# Patient Record
Sex: Female | Born: 1970
Health system: Southern US, Community
[De-identification: ages and names within clinical notes are randomized; demographics above are authoritative.]

---

## 2007-01-10 ENCOUNTER — Other Ambulatory Visit: Admission: RE | Admit: 2007-01-10 | Discharge: 2007-01-10 | Payer: Self-pay | Admitting: Family Medicine

## 2007-04-23 ENCOUNTER — Encounter: Admission: RE | Admit: 2007-04-23 | Discharge: 2007-04-23 | Payer: Self-pay | Admitting: Family Medicine

## 2008-01-15 ENCOUNTER — Other Ambulatory Visit: Admission: RE | Admit: 2008-01-15 | Discharge: 2008-01-15 | Payer: Self-pay | Admitting: Family Medicine

## 2008-03-22 ENCOUNTER — Emergency Department (HOSPITAL_COMMUNITY): Admission: EM | Admit: 2008-03-22 | Discharge: 2008-03-23 | Payer: Self-pay | Admitting: Emergency Medicine

## 2008-06-19 ENCOUNTER — Encounter: Admission: RE | Admit: 2008-06-19 | Discharge: 2008-06-19 | Payer: Self-pay | Admitting: Family Medicine

## 2010-07-05 ENCOUNTER — Other Ambulatory Visit: Admission: RE | Admit: 2010-07-05 | Discharge: 2010-07-05 | Payer: Self-pay | Admitting: Family Medicine

## 2010-09-15 ENCOUNTER — Other Ambulatory Visit: Payer: Self-pay | Admitting: Family Medicine

## 2010-09-15 DIAGNOSIS — Z1231 Encounter for screening mammogram for malignant neoplasm of breast: Secondary | ICD-10-CM

## 2010-09-20 ENCOUNTER — Ambulatory Visit
Admission: RE | Admit: 2010-09-20 | Discharge: 2010-09-20 | Disposition: A | Discharge status: Home / Self Care | Payer: Self-pay | Source: Ambulatory Visit | Attending: Family Medicine | Admitting: Family Medicine

## 2010-09-20 DIAGNOSIS — Z1231 Encounter for screening mammogram for malignant neoplasm of breast: Secondary | ICD-10-CM

## 2011-03-04 ENCOUNTER — Ambulatory Visit (HOSPITAL_COMMUNITY)
Admission: AD | Admit: 2011-03-04 | Discharge: 2011-03-04 | Disposition: A | Discharge status: Home / Self Care | Payer: BC Managed Care – PPO | Source: Ambulatory Visit | Attending: Orthopedic Surgery | Admitting: Orthopedic Surgery

## 2011-03-04 DIAGNOSIS — M79609 Pain in unspecified limb: Secondary | ICD-10-CM | POA: Insufficient documentation

## 2011-03-04 DIAGNOSIS — M7989 Other specified soft tissue disorders: Secondary | ICD-10-CM | POA: Insufficient documentation

## 2011-03-14 ENCOUNTER — Ambulatory Visit
Admission: RE | Admit: 2011-03-14 | Discharge: 2011-03-14 | Disposition: A | Discharge status: Home / Self Care | Payer: BC Managed Care – PPO | Source: Ambulatory Visit | Attending: Family Medicine | Admitting: Family Medicine

## 2011-03-14 ENCOUNTER — Other Ambulatory Visit: Payer: Self-pay | Admitting: Family Medicine

## 2011-03-14 DIAGNOSIS — R0602 Shortness of breath: Secondary | ICD-10-CM

## 2011-03-14 MED ORDER — IOHEXOL 300 MG/ML  SOLN
125.0000 mL | Freq: Once | INTRAMUSCULAR | Status: AC | PRN
  Administered 2011-03-14: 125 mL via INTRAVENOUS

## 2011-03-15 ENCOUNTER — Other Ambulatory Visit: Payer: Self-pay | Admitting: Family Medicine

## 2012-06-03 ENCOUNTER — Other Ambulatory Visit (HOSPITAL_COMMUNITY)
Admission: RE | Admit: 2012-06-03 | Discharge: 2012-06-03 | Disposition: A | Discharge status: Home / Self Care | Payer: BC Managed Care – PPO | Source: Ambulatory Visit | Attending: Family Medicine | Admitting: Family Medicine

## 2012-06-03 DIAGNOSIS — Z1151 Encounter for screening for human papillomavirus (HPV): Secondary | ICD-10-CM | POA: Insufficient documentation

## 2012-06-03 DIAGNOSIS — Z01419 Encounter for gynecological examination (general) (routine) without abnormal findings: Secondary | ICD-10-CM | POA: Insufficient documentation

## 2012-07-16 ENCOUNTER — Other Ambulatory Visit: Payer: Self-pay | Admitting: Otolaryngology

## 2012-07-16 DIAGNOSIS — H9319 Tinnitus, unspecified ear: Secondary | ICD-10-CM

## 2012-07-16 DIAGNOSIS — H905 Unspecified sensorineural hearing loss: Secondary | ICD-10-CM

## 2012-07-18 ENCOUNTER — Other Ambulatory Visit: Payer: BC Managed Care – PPO

## 2012-08-09 ENCOUNTER — Ambulatory Visit
Admission: RE | Admit: 2012-08-09 | Discharge: 2012-08-09 | Disposition: A | Discharge status: Home / Self Care | Payer: BC Managed Care – PPO | Source: Ambulatory Visit | Attending: Otolaryngology | Admitting: Otolaryngology

## 2012-08-09 DIAGNOSIS — H9319 Tinnitus, unspecified ear: Secondary | ICD-10-CM

## 2012-08-09 DIAGNOSIS — H905 Unspecified sensorineural hearing loss: Secondary | ICD-10-CM

## 2012-08-09 MED ORDER — GADOBENATE DIMEGLUMINE 529 MG/ML IV SOLN
15.0000 mL | Freq: Once | INTRAVENOUS | Status: AC | PRN
  Administered 2012-08-09: 15 mL via INTRAVENOUS

## 2014-04-30 ENCOUNTER — Encounter: Payer: Self-pay | Admitting: Cardiology

## 2014-04-30 ENCOUNTER — Ambulatory Visit (INDEPENDENT_AMBULATORY_CARE_PROVIDER_SITE_OTHER): Payer: BC Managed Care – PPO | Admitting: Cardiology

## 2014-04-30 VITALS — BP 118/78 | HR 64 | Ht 65.5 in | Wt 176.0 lb

## 2014-04-30 DIAGNOSIS — R42 Dizziness and giddiness: Secondary | ICD-10-CM

## 2014-04-30 DIAGNOSIS — R002 Palpitations: Secondary | ICD-10-CM

## 2014-04-30 DIAGNOSIS — R011 Cardiac murmur, unspecified: Secondary | ICD-10-CM

## 2014-04-30 NOTE — Patient Instructions (Addendum)
Your physician has requested that you have an echocardiogram. Echocardiography is a painless test that uses sound waves to create images of your heart. It provides your doctor with information about the size and shape of your heart and how well your heart's chambers and valves are working. This procedure takes approximately one hour. There are no restrictions for this procedure.  Your physician has recommended that you wear a holter monitor. Holter monitors are medical devices that record the heart's electrical activity. Doctors most often use these monitors to diagnose arrhythmias. Arrhythmias are problems with the speed or rhythm of the heartbeat. The monitor is a small, portable device. You can wear one while you do your normal daily activities. This is usually used to diagnose what is causing palpitations/syncope (passing out). 48 hour   Follow up as needed

## 2014-04-30 NOTE — Progress Notes (Signed)
Tiffany Vasquez Date of Birth:  06/05/1971 [facility] [address] [place], [place]  [address] [phone]        Fax   [phone]   History of Present Illness: This pleasant 43 year old woman is seen by me for the first time today.  She is being seen at the request of Dr. Sigmund Hazel for evaluation of palpitations.  She does not have any history of known heart problems.  She does not have any history of exertional chest pain or dyspnea.  She walks 2-3 miles a day.  In August she and her husband did a lot of hiking in [place] and she had no problems.  2 weeks ago she began having palpitations occurring at rest.  She saw her PCP and received EKG and labs and everything checked out okay.  Her symptoms do not occur with exertion.  They occur at rest.  At times her heart feels rapid and at other times it feels normal speed but irregular.  At times it makes her slightly lightheaded and slightly nauseated.  It makes her feel very apprehensive and tense. The patient feels that she may be menopausal.  She had an endometrial ablation about 10 years ago.  She has begun to have hot flashes. The patient has been basically healthy.  She does not have any history of high blood pressure diabetes or high cholesterol. Her family history reveals that her mother has a history of a heart murmur and high blood pressure.  Her father did not have heart problems.  He died of lung cancer.   Current Outpatient Prescriptions  Medication Sig Dispense Refill  . rizatriptan (MAXALT) 10 MG tablet Take 10 mg by mouth as needed for migraine.        No current facility-administered medications for this visit.    Not on File  Patient Active Problem List   Diagnosis Date Noted  . Intermittent palpitations 04/30/2014  . Episodic lightheadedness 04/30/2014  . Heart murmur, systolic 04/30/2014    History  Smoking status  . Never Smoker   Smokeless tobacco  . Not on file    History    Alcohol Use No    No family history on file.  The family history is negative for premature heart problems  Review of Systems: Constitutional: no fever chills diaphoresis or fatigue or change in weight.  Head and neck: no hearing loss, no epistaxis, no photophobia or visual disturbance. Respiratory: No cough, shortness of breath or wheezing. Cardiovascular: No chest pain peripheral edema, positive for palpitations at rest Gastrointestinal: No abdominal distention, no abdominal pain, no change in bowel habits hematochezia or melena. Genitourinary: No dysuria, no frequency, no urgency, no nocturia. Musculoskeletal:No arthralgias, no back pain, no gait disturbance or myalgias. Neurological: No dizziness, no headaches, no numbness, no seizures, no syncope, no weakness, no tremors. Hematologic: No lymphadenopathy, no easy bruising. Psychiatric: No confusion, no hallucinations, no sleep disturbance.    Physical Exam: Filed Vitals:   04/30/14 1030  BP: 118/78  Pulse: 64  The patient appears to be in no distress.  Head and neck exam reveals that the pupils are equal and reactive.  The extraocular movements are full.  There is no scleral icterus.  Mouth and pharynx are benign.  No lymphadenopathy.  No carotid bruits.  The jugular venous pressure is normal.  Thyroid is not enlarged or tender.  Chest is clear to percussion and auscultation.  No rales or rhonchi.  Expansion of the  chest is symmetrical.  Heart reveals no abnormal lift or heave.  First and second heart sounds are normal.  The pulse is regular.  No premature beats heard. There is a grade 1/6 systolic ejection murmur at the base.  The abdomen is soft and nontender.  Bowel sounds are normoactive.  There is no hepatosplenomegaly or mass.  There are no abdominal bruits.  Extremities reveal no phlebitis or edema.  Pedal pulses are good.  There is no cyanosis or clubbing.  Neurologic exam is normal strength and no lateralizing  weakness.  No sensory deficits.  Integument reveals no rash  EKG shows normal sinus rhythm and is within normal limits.  The QTc interval is normal.  Assessment / Plan: 1. palpitations at rest, recent onset 2. systolic heart murmur 3. possible perimenopausal symptoms with hot flashes  Disposition: The etiology of her palpitations is not clear.  We want to confirm the presence of her arrhythmia.  She states that she is having these symptoms on a daily basis.  We will have her wear a 48 hour Holter monitor.  We will also have her return for a two-dimensional echo.  Anything she the opportunity to see this pleasant woman with you.  I will be in touch with you regarding the results of her studies.

## 2014-05-06 ENCOUNTER — Encounter: Payer: Self-pay | Admitting: *Deleted

## 2014-05-06 ENCOUNTER — Encounter (INDEPENDENT_AMBULATORY_CARE_PROVIDER_SITE_OTHER): Payer: BC Managed Care – PPO

## 2014-05-06 ENCOUNTER — Telehealth: Payer: Self-pay | Admitting: *Deleted

## 2014-05-06 ENCOUNTER — Ambulatory Visit (HOSPITAL_COMMUNITY): Payer: BC Managed Care – PPO | Attending: Cardiovascular Disease | Admitting: Cardiology

## 2014-05-06 DIAGNOSIS — R011 Cardiac murmur, unspecified: Secondary | ICD-10-CM | POA: Diagnosis not present

## 2014-05-06 DIAGNOSIS — R002 Palpitations: Secondary | ICD-10-CM

## 2014-05-06 DIAGNOSIS — R42 Dizziness and giddiness: Secondary | ICD-10-CM

## 2014-05-06 NOTE — Telephone Encounter (Signed)
Message copied by Burnell BlanksPRATT, Darica Goren B on Wed May 06, 2014 11:21 AM ------      Message from: Cassell ClementBRACKBILL, THOMAS      Created: Wed May 06, 2014 10:38 AM       Please report.  The echocardiogram was normal.  There was good left ventricular systolic function .  Her valve function was normal.      Awaiting results of her event monitor.  Send copy of her echo to Dr. Sigmund HazelLisa Miller ------

## 2014-05-06 NOTE — Progress Notes (Signed)
Echo performed. 

## 2014-05-06 NOTE — Progress Notes (Signed)
Patient ID: Tiffany Vasquez, female   DOB: 08/17/1970, 43 y.o.   MRN: [id number] [employer] 48 hour holter monitor applied to patient.

## 2014-05-06 NOTE — Telephone Encounter (Signed)
Advised patient and will forward to Dr Stevie KernL Miller

## 2014-05-19 ENCOUNTER — Telehealth: Payer: Self-pay | Admitting: *Deleted

## 2014-05-19 NOTE — Telephone Encounter (Signed)
Left message to call back   30 day event monitor reviewed by  Dr. Patty SermonsBrackbill  Interpretation: No significant rhythm disturbance

## 2014-05-19 NOTE — Telephone Encounter (Signed)
Advised patient

## 2014-05-19 NOTE — Telephone Encounter (Signed)
Follow up ° ° ° ° °Returning Tiffany Vasquez's call °

## 2014-05-19 NOTE — Telephone Encounter (Signed)
Left message to call back   Was a 48 hour monitor, not 30 day event monitor.  Interpretation day 1: No significant rhythm disturbance   Interpretation day 2: No significant arrhythmia, rare PAC

## 2015-11-08 DIAGNOSIS — H9312 Tinnitus, left ear: Secondary | ICD-10-CM | POA: Diagnosis not present

## 2015-11-24 DIAGNOSIS — E663 Overweight: Secondary | ICD-10-CM | POA: Diagnosis not present

## 2015-11-24 DIAGNOSIS — N951 Menopausal and female climacteric states: Secondary | ICD-10-CM | POA: Diagnosis not present

## 2015-11-24 DIAGNOSIS — Z0000 Encounter for general adult medical examination without abnormal findings: Secondary | ICD-10-CM | POA: Diagnosis not present

## 2015-11-24 DIAGNOSIS — G43009 Migraine without aura, not intractable, without status migrainosus: Secondary | ICD-10-CM | POA: Diagnosis not present

## 2015-11-24 DIAGNOSIS — E559 Vitamin D deficiency, unspecified: Secondary | ICD-10-CM | POA: Diagnosis not present

## 2015-11-30 DIAGNOSIS — E559 Vitamin D deficiency, unspecified: Secondary | ICD-10-CM | POA: Diagnosis not present

## 2015-11-30 DIAGNOSIS — Z0000 Encounter for general adult medical examination without abnormal findings: Secondary | ICD-10-CM | POA: Diagnosis not present

## 2015-11-30 DIAGNOSIS — E663 Overweight: Secondary | ICD-10-CM | POA: Diagnosis not present

## 2016-01-18 DIAGNOSIS — J029 Acute pharyngitis, unspecified: Secondary | ICD-10-CM | POA: Diagnosis not present

## 2016-01-18 DIAGNOSIS — J020 Streptococcal pharyngitis: Secondary | ICD-10-CM | POA: Diagnosis not present

## 2016-03-04 DIAGNOSIS — L259 Unspecified contact dermatitis, unspecified cause: Secondary | ICD-10-CM | POA: Diagnosis not present

## 2016-04-05 DIAGNOSIS — J069 Acute upper respiratory infection, unspecified: Secondary | ICD-10-CM | POA: Diagnosis not present

## 2016-04-05 DIAGNOSIS — M791 Myalgia: Secondary | ICD-10-CM | POA: Diagnosis not present

## 2016-04-28 ENCOUNTER — Other Ambulatory Visit: Payer: Self-pay | Admitting: Family Medicine

## 2016-04-28 DIAGNOSIS — Z1231 Encounter for screening mammogram for malignant neoplasm of breast: Secondary | ICD-10-CM

## 2016-05-26 DIAGNOSIS — Z23 Encounter for immunization: Secondary | ICD-10-CM | POA: Diagnosis not present

## 2016-05-31 ENCOUNTER — Ambulatory Visit
Admission: RE | Admit: 2016-05-31 | Discharge: 2016-05-31 | Disposition: A | Discharge status: Home / Self Care | Payer: BLUE CROSS/BLUE SHIELD | Source: Ambulatory Visit | Attending: Family Medicine | Admitting: Family Medicine

## 2016-05-31 DIAGNOSIS — Z1231 Encounter for screening mammogram for malignant neoplasm of breast: Secondary | ICD-10-CM

## 2016-06-19 DIAGNOSIS — S99922A Unspecified injury of left foot, initial encounter: Secondary | ICD-10-CM | POA: Diagnosis not present

## 2016-11-29 ENCOUNTER — Other Ambulatory Visit (HOSPITAL_COMMUNITY)
Admission: RE | Admit: 2016-11-29 | Discharge: 2016-11-29 | Disposition: A | Discharge status: Home / Self Care | Payer: BLUE CROSS/BLUE SHIELD | Source: Ambulatory Visit | Attending: Family Medicine | Admitting: Family Medicine

## 2016-11-29 ENCOUNTER — Other Ambulatory Visit: Payer: Self-pay | Admitting: Family Medicine

## 2016-11-29 DIAGNOSIS — Z124 Encounter for screening for malignant neoplasm of cervix: Secondary | ICD-10-CM | POA: Insufficient documentation

## 2016-11-29 DIAGNOSIS — Z0000 Encounter for general adult medical examination without abnormal findings: Secondary | ICD-10-CM | POA: Diagnosis not present

## 2016-12-01 LAB — CYTOLOGY - PAP
DIAGNOSIS: NEGATIVE
HPV (WINDOPATH): NOT DETECTED

## 2016-12-12 DIAGNOSIS — E559 Vitamin D deficiency, unspecified: Secondary | ICD-10-CM | POA: Diagnosis not present

## 2016-12-12 DIAGNOSIS — Z0000 Encounter for general adult medical examination without abnormal findings: Secondary | ICD-10-CM | POA: Diagnosis not present

## 2016-12-12 DIAGNOSIS — Z13220 Encounter for screening for lipoid disorders: Secondary | ICD-10-CM | POA: Diagnosis not present

## 2016-12-12 DIAGNOSIS — Z131 Encounter for screening for diabetes mellitus: Secondary | ICD-10-CM | POA: Diagnosis not present

## 2017-03-26 DIAGNOSIS — Z23 Encounter for immunization: Secondary | ICD-10-CM | POA: Diagnosis not present

## 2017-03-28 ENCOUNTER — Other Ambulatory Visit: Payer: Self-pay | Admitting: Family Medicine

## 2017-03-28 ENCOUNTER — Ambulatory Visit
Admission: RE | Admit: 2017-03-28 | Discharge: 2017-03-28 | Disposition: A | Discharge status: Home / Self Care | Payer: BLUE CROSS/BLUE SHIELD | Source: Ambulatory Visit | Attending: Family Medicine | Admitting: Family Medicine

## 2017-03-28 DIAGNOSIS — M545 Low back pain: Principal | ICD-10-CM

## 2017-03-28 DIAGNOSIS — G8929 Other chronic pain: Secondary | ICD-10-CM

## 2017-04-02 DIAGNOSIS — M545 Low back pain: Secondary | ICD-10-CM | POA: Diagnosis not present

## 2017-04-02 DIAGNOSIS — M25552 Pain in left hip: Secondary | ICD-10-CM | POA: Diagnosis not present

## 2017-04-12 DIAGNOSIS — M545 Low back pain: Secondary | ICD-10-CM | POA: Diagnosis not present

## 2017-04-12 DIAGNOSIS — M25552 Pain in left hip: Secondary | ICD-10-CM | POA: Diagnosis not present

## 2017-04-30 DIAGNOSIS — M545 Low back pain: Secondary | ICD-10-CM | POA: Diagnosis not present

## 2017-04-30 DIAGNOSIS — M25552 Pain in left hip: Secondary | ICD-10-CM | POA: Diagnosis not present

## 2017-05-09 DIAGNOSIS — M545 Low back pain: Secondary | ICD-10-CM | POA: Diagnosis not present

## 2017-05-21 DIAGNOSIS — M25552 Pain in left hip: Secondary | ICD-10-CM | POA: Diagnosis not present

## 2017-05-21 DIAGNOSIS — M545 Low back pain: Secondary | ICD-10-CM | POA: Diagnosis not present

## 2017-05-22 DIAGNOSIS — D224 Melanocytic nevi of scalp and neck: Secondary | ICD-10-CM | POA: Diagnosis not present

## 2017-05-22 DIAGNOSIS — D2271 Melanocytic nevi of right lower limb, including hip: Secondary | ICD-10-CM | POA: Diagnosis not present

## 2017-05-22 DIAGNOSIS — D2230 Melanocytic nevi of unspecified part of face: Secondary | ICD-10-CM | POA: Diagnosis not present

## 2017-05-22 DIAGNOSIS — D2221 Melanocytic nevi of right ear and external auricular canal: Secondary | ICD-10-CM | POA: Diagnosis not present

## 2017-05-23 ENCOUNTER — Other Ambulatory Visit: Payer: Self-pay | Admitting: Family Medicine

## 2017-05-23 DIAGNOSIS — Z1231 Encounter for screening mammogram for malignant neoplasm of breast: Secondary | ICD-10-CM

## 2017-05-29 DIAGNOSIS — N951 Menopausal and female climacteric states: Secondary | ICD-10-CM | POA: Diagnosis not present

## 2017-06-20 ENCOUNTER — Ambulatory Visit: Payer: BLUE CROSS/BLUE SHIELD

## 2017-07-19 ENCOUNTER — Ambulatory Visit: Payer: BLUE CROSS/BLUE SHIELD

## 2017-08-08 DIAGNOSIS — N951 Menopausal and female climacteric states: Secondary | ICD-10-CM | POA: Diagnosis not present

## 2017-08-17 ENCOUNTER — Ambulatory Visit
Admission: RE | Admit: 2017-08-17 | Discharge: 2017-08-17 | Disposition: A | Discharge status: Home / Self Care | Payer: BLUE CROSS/BLUE SHIELD | Source: Ambulatory Visit | Attending: Family Medicine | Admitting: Family Medicine

## 2017-08-17 DIAGNOSIS — Z1231 Encounter for screening mammogram for malignant neoplasm of breast: Secondary | ICD-10-CM | POA: Diagnosis not present

## 2018-04-10 DIAGNOSIS — Z23 Encounter for immunization: Secondary | ICD-10-CM | POA: Diagnosis not present

## 2018-04-17 DIAGNOSIS — R5383 Other fatigue: Secondary | ICD-10-CM | POA: Diagnosis not present

## 2018-04-17 DIAGNOSIS — E559 Vitamin D deficiency, unspecified: Secondary | ICD-10-CM | POA: Diagnosis not present

## 2018-04-17 DIAGNOSIS — Z0000 Encounter for general adult medical examination without abnormal findings: Secondary | ICD-10-CM | POA: Diagnosis not present

## 2018-05-29 DIAGNOSIS — D2221 Melanocytic nevi of right ear and external auricular canal: Secondary | ICD-10-CM | POA: Diagnosis not present

## 2018-05-29 DIAGNOSIS — Z808 Family history of malignant neoplasm of other organs or systems: Secondary | ICD-10-CM | POA: Diagnosis not present

## 2018-05-29 DIAGNOSIS — Z85828 Personal history of other malignant neoplasm of skin: Secondary | ICD-10-CM | POA: Diagnosis not present

## 2018-05-29 DIAGNOSIS — D224 Melanocytic nevi of scalp and neck: Secondary | ICD-10-CM | POA: Diagnosis not present

## 2018-05-29 DIAGNOSIS — Z23 Encounter for immunization: Secondary | ICD-10-CM | POA: Diagnosis not present

## 2018-05-29 DIAGNOSIS — L918 Other hypertrophic disorders of the skin: Secondary | ICD-10-CM | POA: Diagnosis not present

## 2018-06-25 DIAGNOSIS — L089 Local infection of the skin and subcutaneous tissue, unspecified: Secondary | ICD-10-CM | POA: Diagnosis not present

## 2018-06-25 DIAGNOSIS — W57XXXA Bitten or stung by nonvenomous insect and other nonvenomous arthropods, initial encounter: Secondary | ICD-10-CM | POA: Diagnosis not present

## 2018-08-15 ENCOUNTER — Other Ambulatory Visit: Payer: Self-pay | Admitting: Family Medicine

## 2018-08-15 DIAGNOSIS — Z1231 Encounter for screening mammogram for malignant neoplasm of breast: Secondary | ICD-10-CM

## 2018-08-21 DIAGNOSIS — R0683 Snoring: Secondary | ICD-10-CM | POA: Diagnosis not present

## 2018-08-21 DIAGNOSIS — G4719 Other hypersomnia: Secondary | ICD-10-CM | POA: Diagnosis not present

## 2018-08-21 DIAGNOSIS — G478 Other sleep disorders: Secondary | ICD-10-CM | POA: Diagnosis not present

## 2018-08-21 DIAGNOSIS — N951 Menopausal and female climacteric states: Secondary | ICD-10-CM | POA: Diagnosis not present

## 2018-08-22 DIAGNOSIS — D485 Neoplasm of uncertain behavior of skin: Secondary | ICD-10-CM | POA: Diagnosis not present

## 2018-08-22 DIAGNOSIS — L570 Actinic keratosis: Secondary | ICD-10-CM | POA: Diagnosis not present

## 2018-09-11 ENCOUNTER — Ambulatory Visit
Admission: RE | Admit: 2018-09-11 | Discharge: 2018-09-11 | Disposition: A | Discharge status: Home / Self Care | Payer: BLUE CROSS/BLUE SHIELD | Source: Ambulatory Visit | Attending: Family Medicine | Admitting: Family Medicine

## 2018-09-11 DIAGNOSIS — Z1231 Encounter for screening mammogram for malignant neoplasm of breast: Secondary | ICD-10-CM | POA: Diagnosis not present

## 2018-09-18 DIAGNOSIS — G4710 Hypersomnia, unspecified: Secondary | ICD-10-CM | POA: Diagnosis not present

## 2018-09-18 DIAGNOSIS — G4719 Other hypersomnia: Secondary | ICD-10-CM | POA: Diagnosis not present

## 2019-02-11 ENCOUNTER — Other Ambulatory Visit: Payer: Self-pay | Admitting: *Deleted

## 2019-02-11 DIAGNOSIS — R6889 Other general symptoms and signs: Secondary | ICD-10-CM | POA: Diagnosis not present

## 2019-02-11 DIAGNOSIS — Z20822 Contact with and (suspected) exposure to covid-19: Secondary | ICD-10-CM

## 2019-02-14 LAB — NOVEL CORONAVIRUS, NAA: SARS-CoV-2, NAA: NOT DETECTED

## 2019-04-15 DIAGNOSIS — M62830 Muscle spasm of back: Secondary | ICD-10-CM | POA: Diagnosis not present

## 2019-05-14 DIAGNOSIS — N951 Menopausal and female climacteric states: Secondary | ICD-10-CM | POA: Diagnosis not present

## 2019-05-14 DIAGNOSIS — Z0000 Encounter for general adult medical examination without abnormal findings: Secondary | ICD-10-CM | POA: Diagnosis not present

## 2019-05-14 DIAGNOSIS — Z13220 Encounter for screening for lipoid disorders: Secondary | ICD-10-CM | POA: Diagnosis not present

## 2019-05-14 DIAGNOSIS — E559 Vitamin D deficiency, unspecified: Secondary | ICD-10-CM | POA: Diagnosis not present

## 2019-05-14 DIAGNOSIS — E669 Obesity, unspecified: Secondary | ICD-10-CM | POA: Diagnosis not present

## 2019-05-14 DIAGNOSIS — Z131 Encounter for screening for diabetes mellitus: Secondary | ICD-10-CM | POA: Diagnosis not present

## 2019-07-17 DIAGNOSIS — D485 Neoplasm of uncertain behavior of skin: Secondary | ICD-10-CM | POA: Diagnosis not present

## 2019-07-17 DIAGNOSIS — L570 Actinic keratosis: Secondary | ICD-10-CM | POA: Diagnosis not present

## 2019-07-17 DIAGNOSIS — L810 Postinflammatory hyperpigmentation: Secondary | ICD-10-CM | POA: Diagnosis not present

## 2019-08-04 ENCOUNTER — Other Ambulatory Visit (HOSPITAL_BASED_OUTPATIENT_CLINIC_OR_DEPARTMENT_OTHER): Payer: Self-pay

## 2019-08-04 DIAGNOSIS — G4710 Hypersomnia, unspecified: Secondary | ICD-10-CM

## 2019-08-23 ENCOUNTER — Other Ambulatory Visit (HOSPITAL_COMMUNITY)
Admission: RE | Admit: 2019-08-23 | Discharge: 2019-08-23 | Disposition: A | Discharge status: Home / Self Care | Payer: BC Managed Care – PPO | Source: Ambulatory Visit | Attending: Internal Medicine | Admitting: Internal Medicine

## 2019-08-23 DIAGNOSIS — Z20822 Contact with and (suspected) exposure to covid-19: Secondary | ICD-10-CM | POA: Diagnosis not present

## 2019-08-23 DIAGNOSIS — Z01812 Encounter for preprocedural laboratory examination: Secondary | ICD-10-CM | POA: Diagnosis not present

## 2019-08-23 LAB — SARS CORONAVIRUS 2 (TAT 6-24 HRS): SARS Coronavirus 2: NEGATIVE

## 2019-08-23 NOTE — Progress Notes (Signed)
LVM for pt to come of rcovid swab, as her sleep study is scheduled for [facility] 08/25/19.

## 2019-08-25 ENCOUNTER — Ambulatory Visit (HOSPITAL_BASED_OUTPATIENT_CLINIC_OR_DEPARTMENT_OTHER): Payer: BC Managed Care – PPO | Attending: Internal Medicine | Admitting: Internal Medicine

## 2019-08-25 ENCOUNTER — Other Ambulatory Visit: Payer: Self-pay

## 2019-08-25 DIAGNOSIS — G4710 Hypersomnia, unspecified: Secondary | ICD-10-CM | POA: Insufficient documentation

## 2019-08-26 ENCOUNTER — Ambulatory Visit (HOSPITAL_BASED_OUTPATIENT_CLINIC_OR_DEPARTMENT_OTHER): Payer: BLUE CROSS/BLUE SHIELD | Attending: Internal Medicine | Admitting: Internal Medicine

## 2019-08-26 DIAGNOSIS — G4710 Hypersomnia, unspecified: Secondary | ICD-10-CM

## 2019-08-27 DIAGNOSIS — G4710 Hypersomnia, unspecified: Secondary | ICD-10-CM | POA: Diagnosis not present

## 2019-08-27 NOTE — Procedures (Signed)
   NAME: Tiffany Vasquez DATE OF BIRTH:  08/20/1970 MEDICAL RECORD NUMBER [id number]  LOCATION: [facility]  PHYSICIAN: Deretha Emory  DATE OF STUDY: 08/25/2019  SLEEP STUDY TYPE: Nocturnal Polysomnogram               REFERRING PHYSICIAN: Deretha Emory, MD  INDICATION FOR STUDY: Severe daytime sleepiness with negative HSAT  EPWORTH SLEEPINESS SCORE:  21 HEIGHT: 5\' 5"  (165.1 cm)  WEIGHT: 190 lb (86.2 kg)    Body mass index is 31.62 kg/m.  NECK SIZE: 14 in.  MEDICATIONS Patient self administered medications include: N/A. Medications administered during study include No sleep medicine administered.  SLEEP STUDY TECHNIQUE A multi-channel overnight Polysomnography study was performed. The channels recorded and monitored were central and occipital EEG, electrooculogram (EOG), submentalis EMG (chin), nasal and oral airflow, thoracic and abdominal wall motion, anterior tibialis EMG, snore microphone, electrocardiogram, and a pulse oximetry.  TECHNICAL COMMENTS Comments added by Technician: ONE RESTROOM VISTED. Patient had difficulty initiating sleep. Patient was restless all through the night. Comments added by Scorer: N/A  SLEEP ARCHITECTURE The study was initiated at 10:44:41 PM and terminated at 5:59:59 AM. The total recorded time was 435.3 minutes. EEG confirmed total sleep time was 320.5 minutes yielding a sleep efficiency of 73.6%%. Sleep onset after lights out was 30.8 minutes with a REM latency of 206.0 minutes. The patient spent 10.0%% of the night in stage N1 sleep, 61.0%% in stage N2 sleep, 5.0%% in stage N3 and 24% in REM. Wake after sleep onset (WASO) was 84.0 minutes. The Arousal Index was 26.0/hour.  RESPIRATORY PARAMETERS There were a total of 0 respiratory disturbances out of which 0 were apneas ( 0 obstructive, 0 mixed, 0 central) and 0 hypopneas. The apnea/hypopnea index (AHI) was 0.0 events/hour. The central sleep apnea index was 0.0 events/hour.  The REM AHI was 0.0 events/hour and NREM AHI was 0.0 events/hour. The supine AHI was 0.0 events/hour and the non supine AHI was 0 supine during 9.50% of sleep. Respiratory disturbances were associated with oxygen desaturation down to a nadir of 92.0% during sleep. The mean oxygen saturation during the study was 96.7%. The cumulative time under 88% oxygen saturation was 0 minutes.  LEG MOVEMENT DATA The total leg movements were 0 with a resulting leg movement index of 0.0/hr . Associated arousal with leg movement index was 0.0/hr.  CARDIAC DATA The underlying cardiac rhythm was most consistent with sinus rhythm. Mean heart rate during sleep was 62.3 bpm. Additional rhythm abnormalities include None.  IMPRESSIONS - No Significant Obstructive Sleep apnea(OSA) - No Significant Central Sleep Apnea (CSA)  DIAGNOSIS - Excessive daytime sleepiness.   RECOMMENDATIONS - May proceed with MSLT  Marland Kitchen Sleep specialist, American Board of Internal Medicine  ELECTRONICALLY SIGNED ON:  08/27/2019, 9:18 PM [facility] PH: [phone]   FX: [phone] ACCREDITED BY THE AMERICAN ACADEMY OF SLEEP MEDICINE

## 2019-08-27 NOTE — Procedures (Signed)
    NAME: Tiffany Vasquez DATE OF BIRTH:  1970/11/21 MEDICAL RECORD NUMBER [id number]  LOCATION: [facility]  PHYSICIAN: Deretha Emory  DATE OF STUDY: 08/26/2019  SLEEP STUDY TYPE: Multiple Sleep Latency Test               REFERRING PHYSICIAN: Deretha Emory, MD  INDICATION FOR STUDY: Severe daytime sleepiness with normal HSAT and PSG  EPWORTH SLEEPINESS SCORE:  21 HEIGHT:    WEIGHT:      There is no height or weight on file to calculate BMI.  NECK SIZE:   in.  MEDICATIONS Patient self administered medications include: N/A. Medications administered during study include No sleep medicine administered.Marland Kitchen  SLEEP STUDY TECHNIQUE A multiple sleep latency test was performed. The channels recorded and monitored were central and occipital EEG, electrooculogram (EOG), submentalis EMG (chin), and electrocardiogram.  TECHNICAL COMMENTS Comments added by Technician: None Comments added by Scorer: N/A  IMPRESSIONS - No sleep onset REMs present. This study does not suggest narcolepsy. - Total number of naps attempted: 5 . Total number of naps with sleep attained: 5 - Pathologic sleepiness is suggested by short mean sleep latency of 5:07 minutes  DIAGNOSIS - Idiopathic hypersomnia (327.11 [G47.11 ICD-10])  RECOMMENDATIONS - Return for follow up and management of Idiopathic Hypersomnia.  Deretha Emory Sleep specialist, American Board of Internal Medicine  ELECTRONICALLY SIGNED ON:  08/27/2019, 9:30 PM [facility] PH: [phone]   FX: [phone] ACCREDITED BY THE AMERICAN ACADEMY OF SLEEP MEDICINE

## 2019-08-28 DIAGNOSIS — G4711 Idiopathic hypersomnia with long sleep time: Secondary | ICD-10-CM | POA: Diagnosis not present

## 2019-09-06 DIAGNOSIS — Z23 Encounter for immunization: Secondary | ICD-10-CM | POA: Diagnosis not present

## 2019-09-10 DIAGNOSIS — N951 Menopausal and female climacteric states: Secondary | ICD-10-CM | POA: Diagnosis not present

## 2019-09-10 DIAGNOSIS — G4712 Idiopathic hypersomnia without long sleep time: Secondary | ICD-10-CM | POA: Diagnosis not present

## 2019-10-04 DIAGNOSIS — Z23 Encounter for immunization: Secondary | ICD-10-CM | POA: Diagnosis not present

## 2019-10-30 DIAGNOSIS — L821 Other seborrheic keratosis: Secondary | ICD-10-CM | POA: Diagnosis not present

## 2019-10-30 DIAGNOSIS — D225 Melanocytic nevi of trunk: Secondary | ICD-10-CM | POA: Diagnosis not present

## 2019-10-30 DIAGNOSIS — L578 Other skin changes due to chronic exposure to nonionizing radiation: Secondary | ICD-10-CM | POA: Diagnosis not present

## 2019-11-25 DIAGNOSIS — G43909 Migraine, unspecified, not intractable, without status migrainosus: Secondary | ICD-10-CM | POA: Diagnosis not present

## 2019-11-25 DIAGNOSIS — Z6833 Body mass index (BMI) 33.0-33.9, adult: Secondary | ICD-10-CM | POA: Diagnosis not present

## 2019-11-25 DIAGNOSIS — G4711 Idiopathic hypersomnia with long sleep time: Secondary | ICD-10-CM | POA: Diagnosis not present

## 2019-11-25 DIAGNOSIS — N959 Unspecified menopausal and perimenopausal disorder: Secondary | ICD-10-CM | POA: Diagnosis not present

## 2019-12-16 DIAGNOSIS — Z713 Dietary counseling and surveillance: Secondary | ICD-10-CM | POA: Diagnosis not present

## 2019-12-16 DIAGNOSIS — Z6833 Body mass index (BMI) 33.0-33.9, adult: Secondary | ICD-10-CM | POA: Diagnosis not present

## 2019-12-16 DIAGNOSIS — E669 Obesity, unspecified: Secondary | ICD-10-CM | POA: Diagnosis not present

## 2019-12-17 DIAGNOSIS — Z1231 Encounter for screening mammogram for malignant neoplasm of breast: Secondary | ICD-10-CM | POA: Diagnosis not present

## 2020-01-13 DIAGNOSIS — Z713 Dietary counseling and surveillance: Secondary | ICD-10-CM | POA: Diagnosis not present

## 2020-01-13 DIAGNOSIS — E669 Obesity, unspecified: Secondary | ICD-10-CM | POA: Diagnosis not present

## 2020-01-13 DIAGNOSIS — Z6833 Body mass index (BMI) 33.0-33.9, adult: Secondary | ICD-10-CM | POA: Diagnosis not present

## 2020-01-24 DIAGNOSIS — J329 Chronic sinusitis, unspecified: Secondary | ICD-10-CM | POA: Diagnosis not present

## 2020-02-20 DIAGNOSIS — J029 Acute pharyngitis, unspecified: Secondary | ICD-10-CM | POA: Diagnosis not present

## 2020-06-10 DIAGNOSIS — G43909 Migraine, unspecified, not intractable, without status migrainosus: Secondary | ICD-10-CM | POA: Diagnosis not present

## 2020-06-10 DIAGNOSIS — Z0000 Encounter for general adult medical examination without abnormal findings: Secondary | ICD-10-CM | POA: Diagnosis not present

## 2020-06-10 DIAGNOSIS — E669 Obesity, unspecified: Secondary | ICD-10-CM | POA: Diagnosis not present

## 2020-06-10 DIAGNOSIS — Z0189 Encounter for other specified special examinations: Secondary | ICD-10-CM | POA: Diagnosis not present

## 2020-06-10 DIAGNOSIS — M5416 Radiculopathy, lumbar region: Secondary | ICD-10-CM | POA: Diagnosis not present

## 2020-06-10 DIAGNOSIS — Z1159 Encounter for screening for other viral diseases: Secondary | ICD-10-CM | POA: Diagnosis not present

## 2020-06-10 DIAGNOSIS — Z13220 Encounter for screening for lipoid disorders: Secondary | ICD-10-CM | POA: Diagnosis not present

## 2020-06-10 DIAGNOSIS — Z131 Encounter for screening for diabetes mellitus: Secondary | ICD-10-CM | POA: Diagnosis not present

## 2020-11-22 IMAGING — MG DIGITAL SCREENING BILATERAL MAMMOGRAM WITH TOMO AND CAD
6 series · 6 of 22 positions shown · non-contrast
Comparison: Previous exam(s).

CLINICAL DATA: Screening.

EXAM:
DIGITAL SCREENING BILATERAL MAMMOGRAM WITH TOMO AND CAD

[R MLO synth-2D]
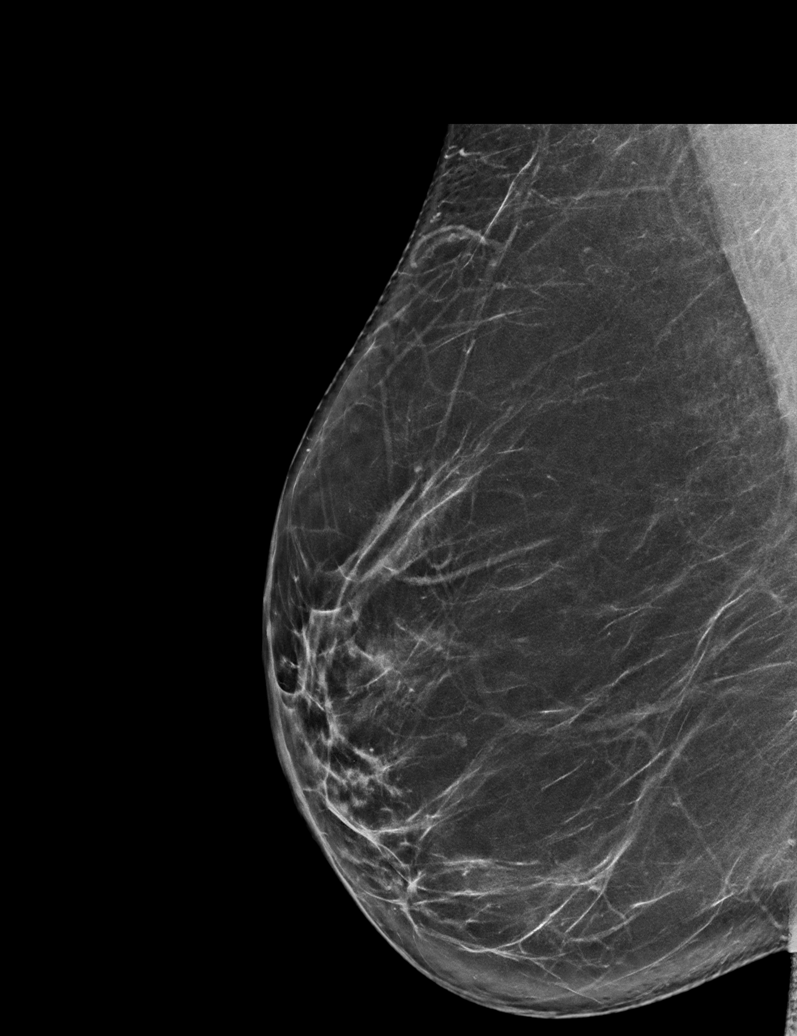

[R CC synth-2D]
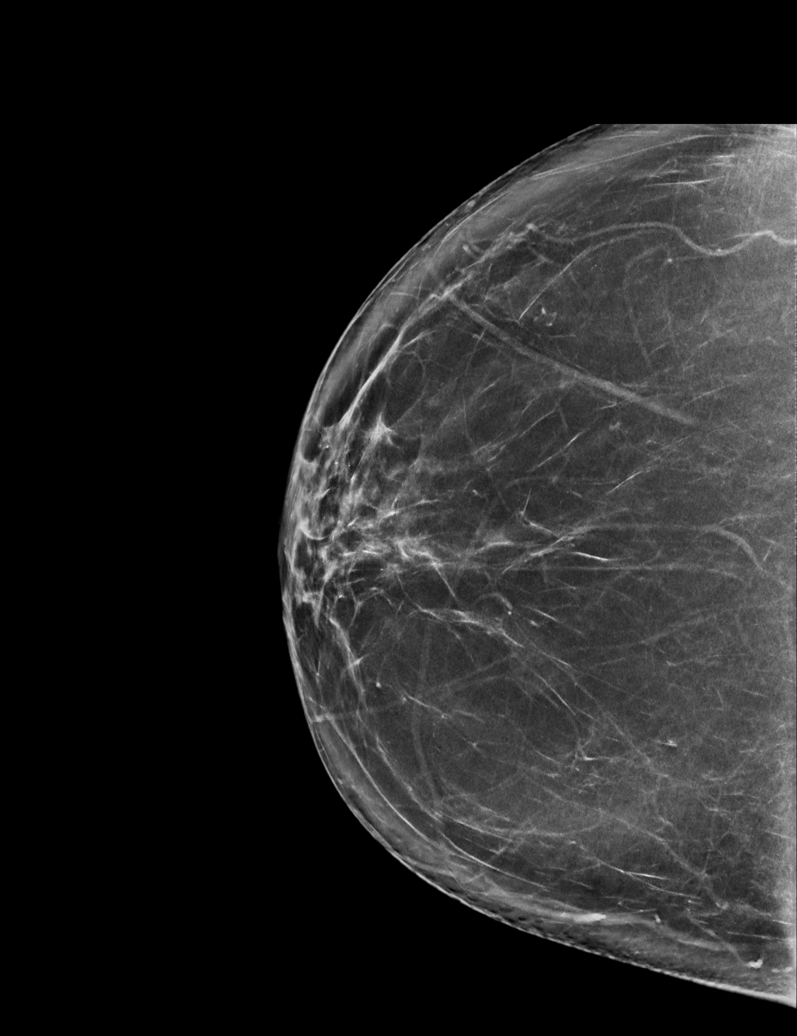

[R MLO tomo · tomo slice 41/81.0]
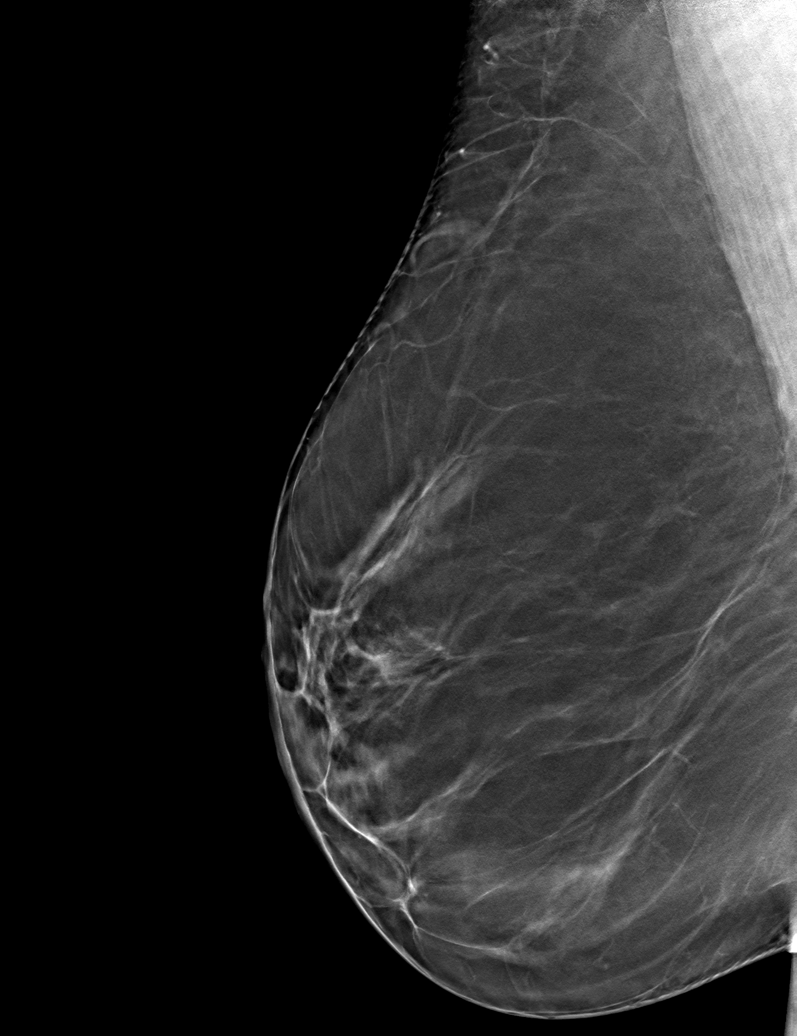

[L MLO tomo · tomo slice 44/87.0]
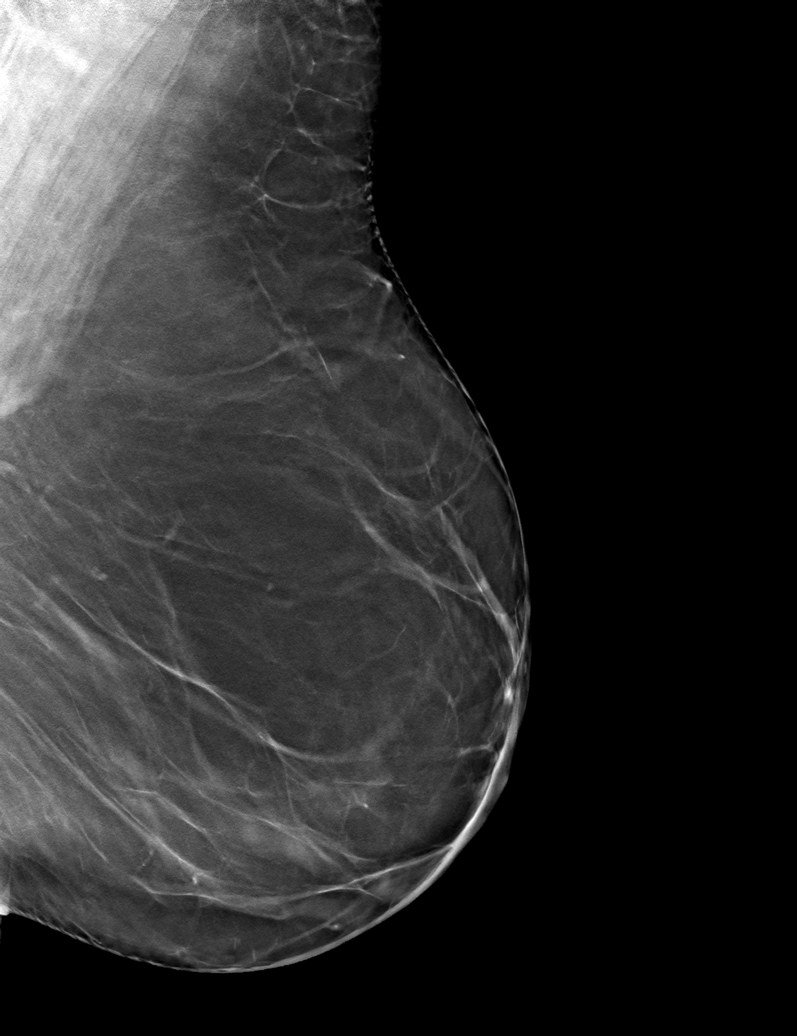

[L CC tomo · tomo slice 47/93.0]
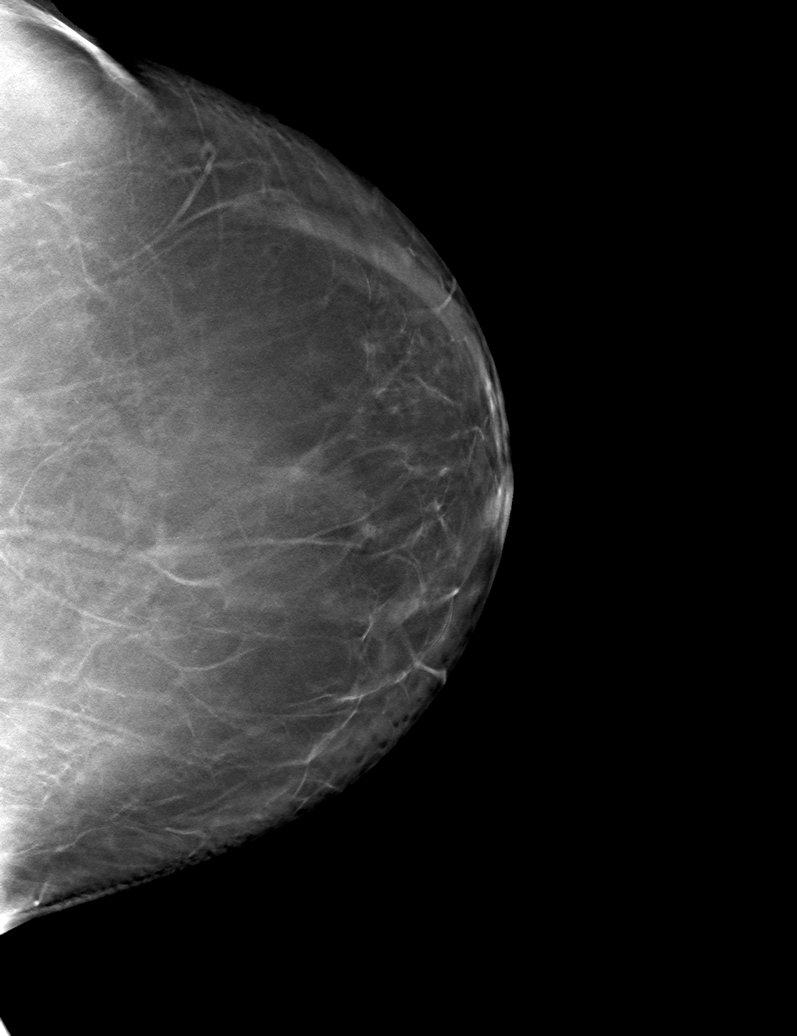

[R CC tomo · tomo slice 41/82.0]
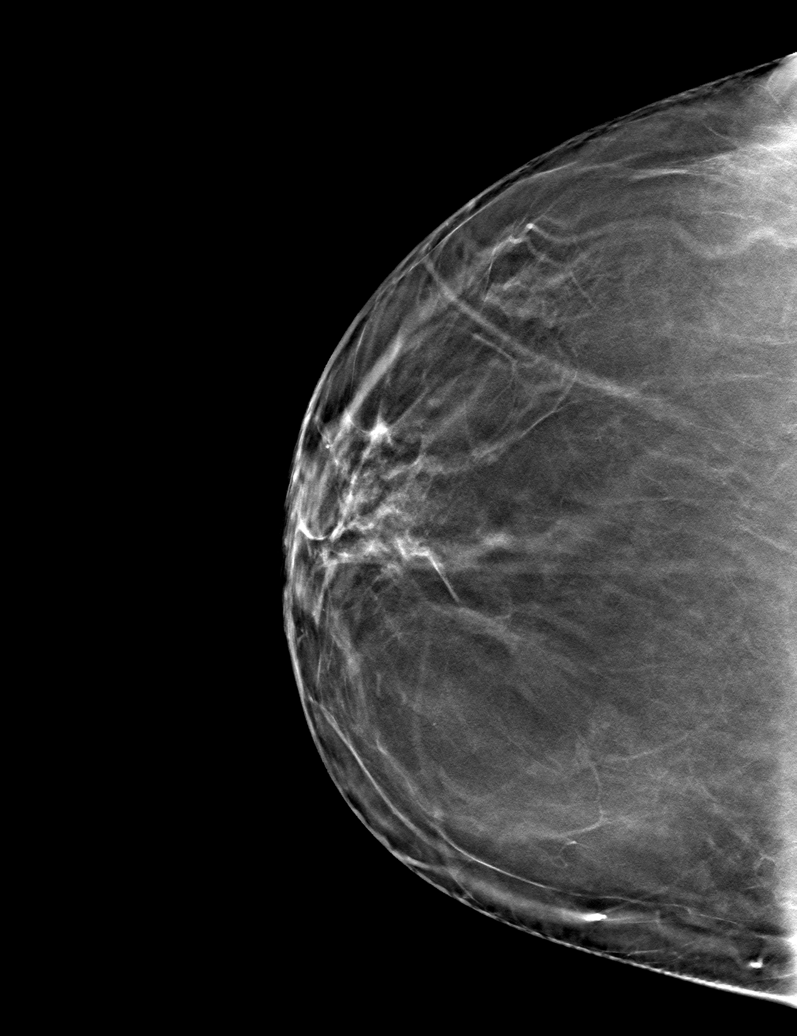

[6 of 22 positions shown; findings below may reference images not displayed]

ACR Breast Density Category b: There are scattered areas of
fibroglandular density.
FINDINGS: There are no findings suspicious for malignancy. Images were
processed with CAD.
IMPRESSION: No mammographic evidence of malignancy. A result letter of this
screening mammogram will be mailed directly to the patient.

RECOMMENDATION:
Screening mammogram in one year. (Code:[id number])

BI-RADS CATEGORY  1: Negative.

## 2023-09-02 ENCOUNTER — Ambulatory Visit: Payer: BC Managed Care – PPO
